# Patient Record
Sex: Male | Born: 1955 | Race: Black or African American | Hispanic: No | Marital: Married | Smoking: Current every day smoker
Health system: Southern US, Community
[De-identification: ages and names within clinical notes are randomized; demographics above are authoritative.]

## PROBLEM LIST (undated history)

## (undated) DIAGNOSIS — E119 Type 2 diabetes mellitus without complications: Secondary | ICD-10-CM

## (undated) DIAGNOSIS — E7800 Pure hypercholesterolemia, unspecified: Secondary | ICD-10-CM

## (undated) DIAGNOSIS — I10 Essential (primary) hypertension: Secondary | ICD-10-CM

---

## 2006-09-29 ENCOUNTER — Inpatient Hospital Stay (HOSPITAL_COMMUNITY): Admission: EM | Admit: 2006-09-29 | Discharge: 2006-09-30 | Payer: Self-pay | Admitting: Emergency Medicine

## 2018-06-09 ENCOUNTER — Emergency Department (HOSPITAL_BASED_OUTPATIENT_CLINIC_OR_DEPARTMENT_OTHER): Payer: POS

## 2018-06-09 ENCOUNTER — Emergency Department (HOSPITAL_BASED_OUTPATIENT_CLINIC_OR_DEPARTMENT_OTHER)
Admission: EM | Admit: 2018-06-09 | Discharge: 2018-06-09 | Disposition: A | Discharge status: Home / Self Care | Payer: POS | Attending: Emergency Medicine | Admitting: Emergency Medicine

## 2018-06-09 ENCOUNTER — Other Ambulatory Visit: Payer: Self-pay

## 2018-06-09 ENCOUNTER — Encounter (HOSPITAL_BASED_OUTPATIENT_CLINIC_OR_DEPARTMENT_OTHER): Payer: Self-pay | Admitting: *Deleted

## 2018-06-09 DIAGNOSIS — Y92009 Unspecified place in unspecified non-institutional (private) residence as the place of occurrence of the external cause: Secondary | ICD-10-CM | POA: Insufficient documentation

## 2018-06-09 DIAGNOSIS — E119 Type 2 diabetes mellitus without complications: Secondary | ICD-10-CM | POA: Diagnosis not present

## 2018-06-09 DIAGNOSIS — S39012A Strain of muscle, fascia and tendon of lower back, initial encounter: Secondary | ICD-10-CM

## 2018-06-09 DIAGNOSIS — Z7984 Long term (current) use of oral hypoglycemic drugs: Secondary | ICD-10-CM | POA: Diagnosis not present

## 2018-06-09 DIAGNOSIS — Z79899 Other long term (current) drug therapy: Secondary | ICD-10-CM | POA: Insufficient documentation

## 2018-06-09 DIAGNOSIS — S20212A Contusion of left front wall of thorax, initial encounter: Secondary | ICD-10-CM | POA: Insufficient documentation

## 2018-06-09 DIAGNOSIS — W11XXXA Fall on and from ladder, initial encounter: Secondary | ICD-10-CM | POA: Insufficient documentation

## 2018-06-09 DIAGNOSIS — F17210 Nicotine dependence, cigarettes, uncomplicated: Secondary | ICD-10-CM | POA: Diagnosis not present

## 2018-06-09 DIAGNOSIS — W19XXXA Unspecified fall, initial encounter: Secondary | ICD-10-CM

## 2018-06-09 DIAGNOSIS — I10 Essential (primary) hypertension: Secondary | ICD-10-CM | POA: Insufficient documentation

## 2018-06-09 DIAGNOSIS — Y939 Activity, unspecified: Secondary | ICD-10-CM | POA: Insufficient documentation

## 2018-06-09 DIAGNOSIS — Y999 Unspecified external cause status: Secondary | ICD-10-CM | POA: Diagnosis not present

## 2018-06-09 DIAGNOSIS — S3992XA Unspecified injury of lower back, initial encounter: Secondary | ICD-10-CM | POA: Diagnosis present

## 2018-06-09 HISTORY — DX: Essential (primary) hypertension: I10

## 2018-06-09 HISTORY — DX: Pure hypercholesterolemia, unspecified: E78.00

## 2018-06-09 HISTORY — DX: Type 2 diabetes mellitus without complications: E11.9

## 2018-06-09 MED ORDER — ACETAMINOPHEN 500 MG PO TABS
1000.0000 mg | ORAL_TABLET | Freq: Once | ORAL | Status: AC
  Administered 2018-06-09: 1000 mg via ORAL
  Filled 2018-06-09: qty 2

## 2018-06-09 NOTE — ED Triage Notes (Signed)
Pt reports he fell 5-6 ft from a ladder this afternoon. States he landed on left shoulder. C/o back pain. Denies LOC. Pt ambulatory to triage

## 2018-06-09 NOTE — ED Provider Notes (Signed)
[facility] EMERGENCY DEPARTMENT Provider Note   CSN: [id number] Arrival date & time: 06/09/18  1928     History   Chief Complaint Chief Complaint  Patient presents with  . Fall    HPI Sergio Cole is a 62 y.o. male.  Patient is a 62 year old male with a history of diabetes, hypertension and hyperlipidemia who presents with left side pain after a fall.  He was doing some work at home on a ladder.  He states he overreached and fell off a ladder about 6 feet, landed on the ground.  He landed on his left side.  He states he did not hit his head.  He denies any neck pain.  There is no loss of consciousness.  He denies any headache.  He has some soreness in his lower back and along his left ribs and left shoulder.  He states he has not had to take anything at home for the pain.  He denies any pain on breathing.  No shortness of breath.  No abdominal pain.  No nausea or vomiting.  He denies any other injuries from the fall.  He denies any numbness or weakness to his extremities.     Past Medical History:  Diagnosis Date  . Diabetes mellitus without complication (HCC)   . High cholesterol   . Hypertension     There are no active problems to display for this patient.   History reviewed. No pertinent surgical history.      Home Medications    Prior to Admission medications   Medication Sig Start Date End Date Taking? Authorizing Provider  fenofibrate 160 MG tablet TAKE 1 TABLET (160 MG TOTAL) BY MOUTH DAILY. 10/02/17  Yes [provider]  lisinopril-hydrochlorothiazide (PRINZIDE,ZESTORETIC) 20-12.5 MG tablet TAKE 2 TABLETS BY MOUTH DAILY. 10/02/17  Yes [provider]  metFORMIN (GLUCOPHAGE) 500 MG tablet TAKE 1 TABLET BY MOUTH TWICE A DAY WITH MEALS 10/02/17  Yes [provider]    Family History No family history on file.  Social History Social History   Tobacco Use  . Smoking status: Current Every Day Smoker    Types: Cigarettes, Pipe    . Smokeless tobacco: Never Used  Substance Use Topics  . Alcohol use: Yes    Comment: occasional  . Drug use: Never     Allergies   Patient has no known allergies.   Review of Systems Review of Systems  Constitutional: Negative for activity change, appetite change and fever.  HENT: Negative for dental problem, nosebleeds and trouble swallowing.   Eyes: Negative for pain and visual disturbance.  Respiratory: Negative for shortness of breath.   Cardiovascular: Positive for chest pain (left rib pain).  Gastrointestinal: Negative for abdominal pain, nausea and vomiting.  Genitourinary: Negative for dysuria and hematuria.  Musculoskeletal: Positive for arthralgias and back pain. Negative for joint swelling and neck pain.  Skin: Negative for wound.  Neurological: Negative for weakness, numbness and headaches.  Psychiatric/Behavioral: Negative for confusion.     Physical Exam Updated Vital Signs BP (!) 155/103 (BP Location: Left Arm)   Pulse 66   Temp 98.6 F (37 C) (Oral)   Resp 18   Ht 5\' 10"  (1.778 m)   Wt 95.3 kg   SpO2 100%   BMI 30.13 kg/m   Physical Exam  Constitutional: He is oriented to person, place, and time. He appears well-developed and well-nourished.  HENT:  Head: Normocephalic and atraumatic.  Nose: Nose normal.  Eyes: Pupils are equal,  round, and reactive to light. Conjunctivae are normal.  Neck:  No pain to the cervical or thoracic spine.  There is some mild tenderness to the lower lumbar sacral spine but primarily the tenderness is along the musculature in the lumbar region bilaterally.  There is no step-offs or deformities.  Cardiovascular: Normal rate and regular rhythm.  No murmur heard. No evidence of external trauma to the chest or abdomen  Pulmonary/Chest: Effort normal and breath sounds normal. No respiratory distress. He has no wheezes. He exhibits tenderness.  Mild tenderness on palpation of the left posterior ribs.  No crepitus or  deformity.  Abdominal: Soft. Bowel sounds are normal. He exhibits no distension. There is no tenderness.  Musculoskeletal: Normal range of motion.  There is some mild tenderness on range of motion of the left shoulder.  No deformity noted.  No pain to the elbow or wrist.  There is minimal tenderness to the right thumb but he has full range of motion without any swelling or deformity noted.  No distinct bony tenderness is noted to his thumb.  There is no other pain on palpation or range of motion of the extremities.  Neurological: He is alert and oriented to person, place, and time.  Motor 5 out of 5 all extremities, sensation grossly intact light touch all extremities  Skin: Skin is warm and dry. Capillary refill takes less than 2 seconds.  Psychiatric: He has a normal mood and affect.  Vitals reviewed.    ED Treatments / Results  Labs (all labs ordered are listed, but only abnormal results are displayed) Labs Reviewed - No data to display  EKG None  Radiology Dg Ribs Unilateral W/chest Left  Result Date: 06/09/2018 CLINICAL DATA:  Patient fell from a ladder. Posterior rib pain and left flank pain. EXAM: LEFT RIBS AND CHEST - 3+ VIEW COMPARISON:  Chest x-ray 09/29/2006. FINDINGS: Radio-opaque marker has been placed on the skin at the site of patient concern. No evidence for an acute displaced left-sided rib fracture. No evidence for left pleural effusion. No left pneumothorax evident. No focal airspace consolidation in the left lung. Visualized bony anatomy of the shoulder shows degenerative changes in the [facility] joint. IMPRESSION: Negative. Electronically Signed   By: Kennith Center M.D.   On: 06/09/2018 21:46   Dg Lumbar Spine Complete  Result Date: 06/09/2018 CLINICAL DATA:  Patient fell from a ladder this evening and presents with posterior rib and left flank pain. EXAM: LUMBAR SPINE - COMPLETE 4+ VIEW COMPARISON:  None. FINDINGS: There are 5 non ribbed lumbar type vertebrae in normal  lumbar lordosis. No pars defects or listhesis. No acute lumbar spine fracture or suspicious osseous lesions. Facet arthropathy with joint space narrowing sclerosis is noted from L3 through S1. Disc spaces are maintained without significant flattening. The included iliac bones and sacroiliac joints appear intact with mild osteoarthritic sclerosis of the SI joints. No diastasis. A lucency involving the left costovertebral junction at T12 may be secondary to osteoarthritic change given what appears to be spurring. However a fracture is not entirely excluded given this appearance. IMPRESSION: Lucency with bony hypertrophic changes at the costovertebral junction of the left twelfth rib. Findings are more likely related to osteoarthritis however the possibility of a subtle fracture is not entirely excluded. Consider CT the lumbar spine for better correlation as deemed clinically necessary. Lumbar facet arthrosis.  No acute appearing lumbar spine fracture. Electronically Signed   By: Tollie Eth M.D.   On: 06/09/2018 21:59  Ct Lumbar Spine Wo Contrast  Result Date: 06/09/2018 CLINICAL DATA:  62 year old male with fall and trauma to the back. EXAM: CT LUMBAR SPINE WITHOUT CONTRAST TECHNIQUE: Multidetector CT imaging of the lumbar spine was performed without intravenous contrast administration. Multiplanar CT image reconstructions were also generated. COMPARISON:  Lumbar spine radiograph dated 06/09/2018 FINDINGS: Segmentation: 5 lumbar type vertebrae. Alignment: No acute subluxation. Vertebrae: No acute fracture. Multiple small sclerotic lesions primarily involving the posterior left iliac bone and left sacral alum, indeterminate, likely bone islands. Multilevel lower lumbar facet hypertrophy. Chronic changes of the SI joints with partial bony fusion. Paraspinal and other soft tissues: There is atherosclerotic calcification of the aorta. The paraspinal soft tissues are unremarkable. Disc levels: There are degenerative  changes of the spine with anterior osteophyte. There is posterior disc bulge at L5-S1. IMPRESSION: No acute/traumatic lumbar spine pathology. Electronically Signed   By: Elgie Collard M.D.   On: 06/09/2018 23:03   Dg Shoulder Left  Result Date: 06/09/2018 CLINICAL DATA:  Fall from ladder.  Left flank and shoulder pain. EXAM: LEFT SHOULDER - 2+ VIEW COMPARISON:  None. FINDINGS: No evidence for acute fracture. No shoulder separation or dislocation. Degenerative changes are seen in the acromioclavicular and glenohumeral joints. Degenerative irregularity is seen at the rotator cuff insertion. IMPRESSION: Degenerative changes without acute bony abnormality. Electronically Signed   By: Kennith Center M.D.   On: 06/09/2018 21:50    Procedures Procedures (including critical care time)  Medications Ordered in ED Medications  acetaminophen (TYLENOL) tablet 1,000 mg (1,000 mg Oral Given 06/09/18 2048)     Initial Impression / Assessment and Plan / ED Course  I have reviewed the triage vital signs and the nursing notes.  Pertinent labs & imaging results that were available during my care of the patient were reviewed by me and considered in my medical decision making (see chart for details).     Patient is a 62 year old male who presents after a fall.  He is not complaining of significant pain but did have some mild tenderness over his left ribs, left shoulder and lower lumbar spine.  He has no abdominal tenderness.  No evidence of external trauma on his chest or abdomen.  No reported head injury.  He is neurologically intact.  His imaging studies do not reveal any evidence of fracture.  The lumbar x-rays were questionable for fracture but a follow-up CT was done which shows no evidence of fracture.  Patient is feeling okay.  Does not request anything for pain other than Tylenol.  He was encouraged to have outpatient follow-up with his PCP.  Final Clinical Impressions(s) / ED Diagnoses   Final  diagnoses:  Fall, initial encounter  Rib contusion, left, initial encounter  Strain of lumbar region, initial encounter    ED Discharge Orders    None       Rolan Bucco, MD 06/09/18 2321

## 2018-06-09 NOTE — ED Notes (Signed)
Patient transported to X-ray 

## 2018-06-09 NOTE — ED Notes (Signed)
Called x1 for room no answer 

## 2018-06-09 NOTE — ED Notes (Signed)
p understood dc material. NAD noted

## 2019-10-28 IMAGING — DX DG LUMBAR SPINE COMPLETE 4+V
5 series · 5 of 5 positions shown · non-contrast
Comparison: None.

CLINICAL DATA: Patient fell from a ladder this evening and presents
with posterior rib and left flank pain.

EXAM:
LUMBAR SPINE - COMPLETE 4+ VIEW

[l-spine ap]
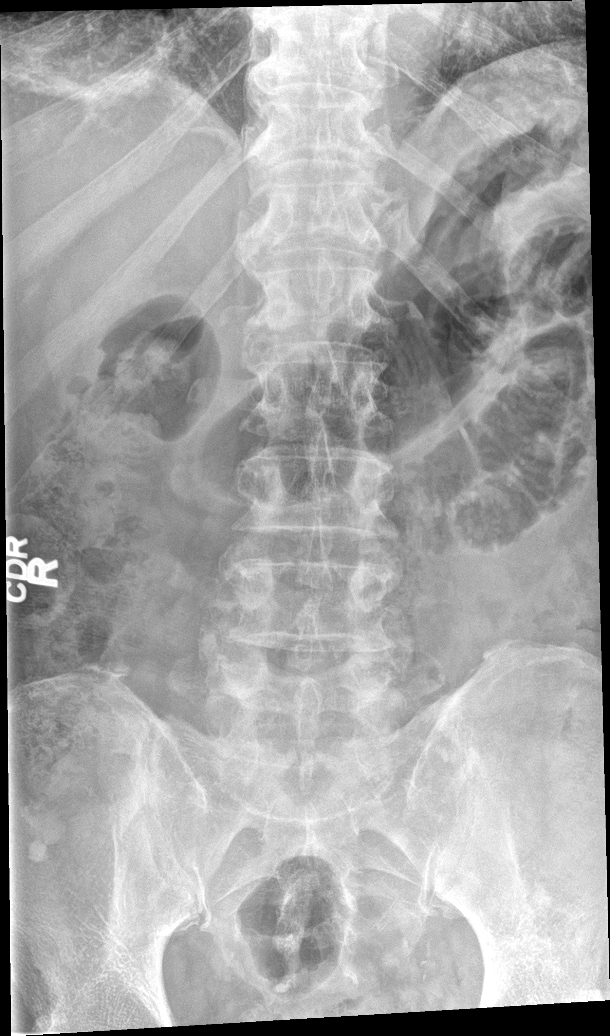

[l-spine obl (1 of 2)]
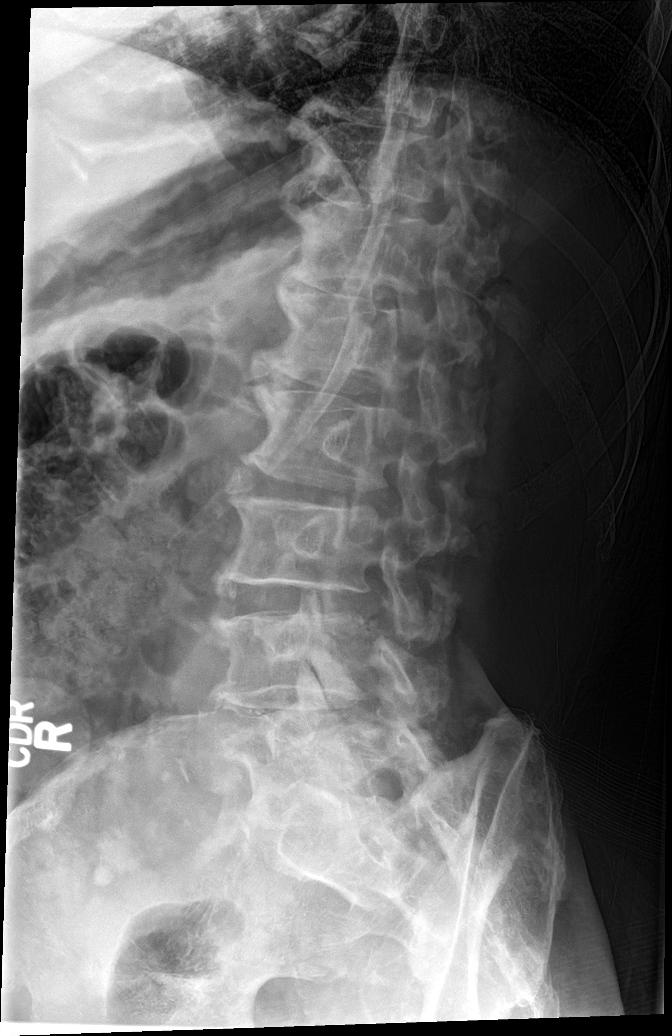

[l-spine obl (2 of 2)]
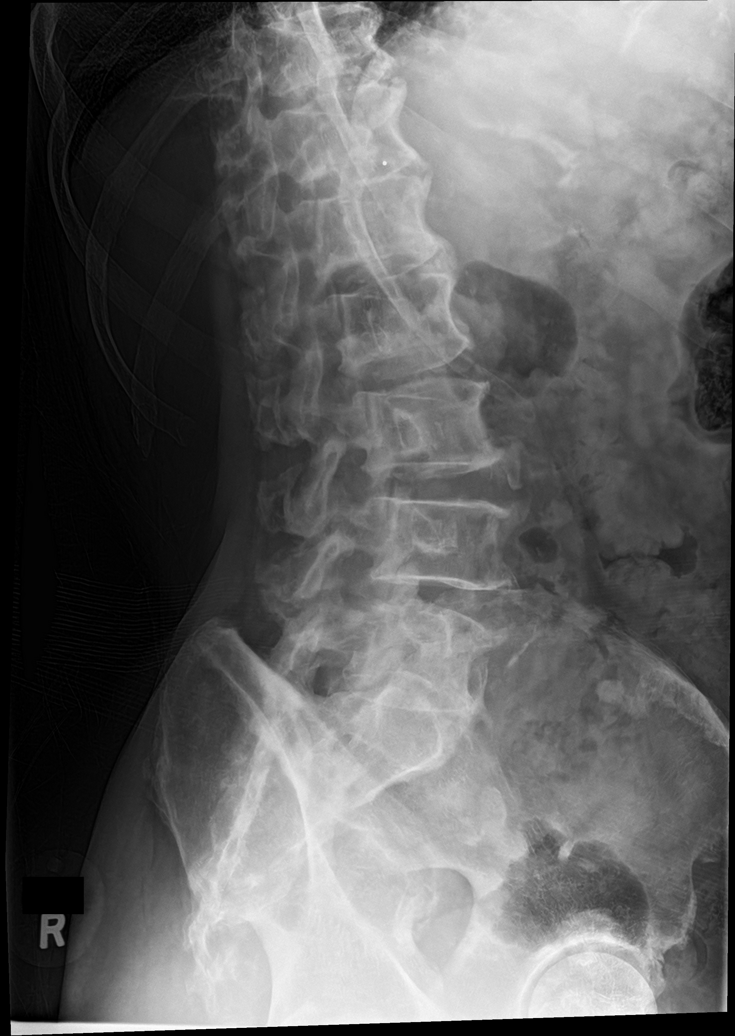

[l-spine lat]
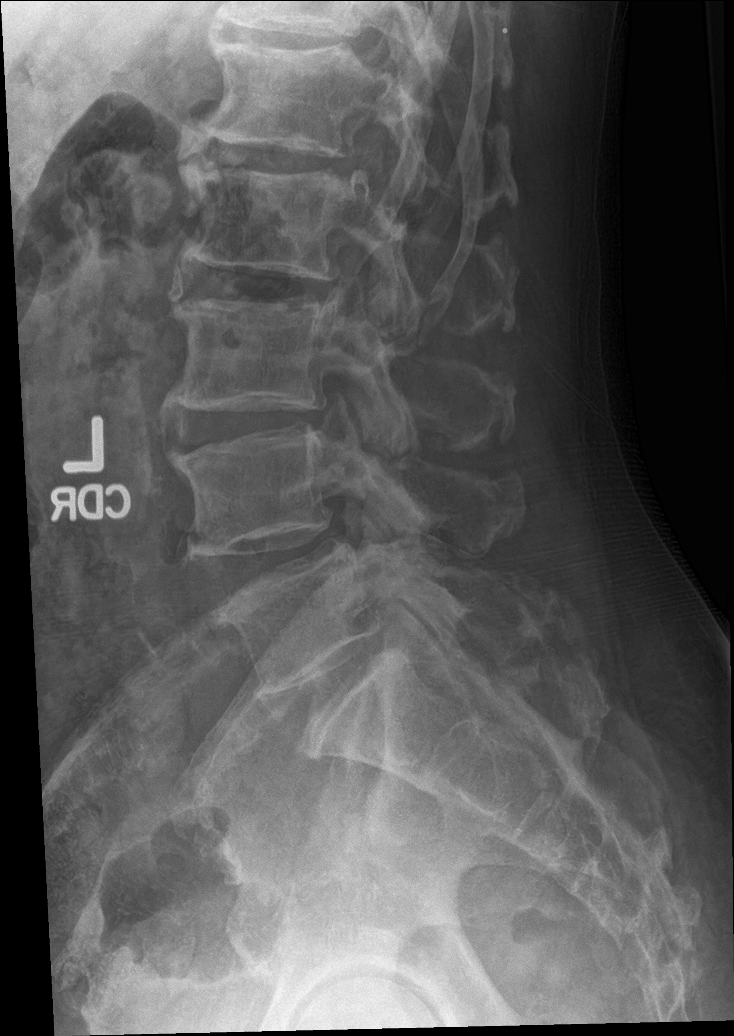

[l-spine spot]
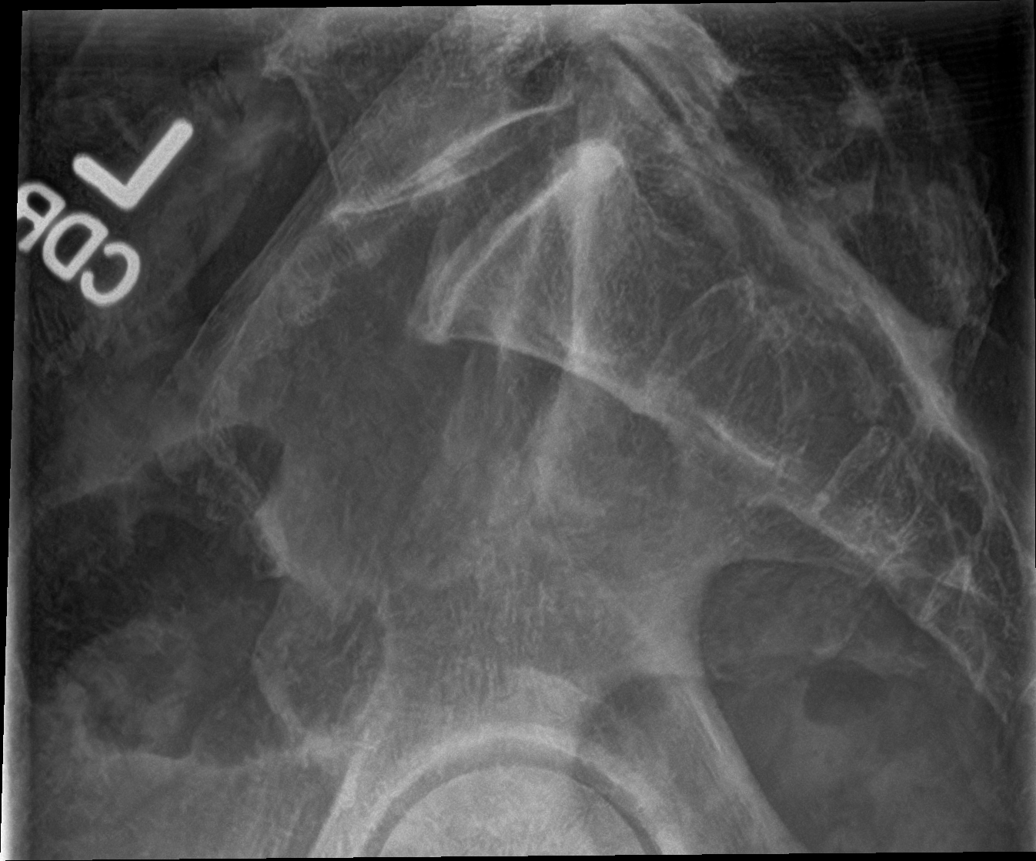

[5 of 5 positions shown; findings below may reference images not displayed]

FINDINGS: There are 5 non ribbed lumbar type vertebrae in normal lumbar
lordosis. No pars defects or listhesis. No acute lumbar spine
fracture or suspicious osseous lesions. Facet arthropathy with joint
space narrowing sclerosis is noted from L3 through S1. Disc spaces
are maintained without significant flattening. The included iliac
bones and sacroiliac joints appear intact with mild osteoarthritic
sclerosis of the SI joints. No diastasis.

A lucency involving the left costovertebral junction at T[DATE] be
secondary to osteoarthritic change given what appears to be
spurring. However a fracture is not entirely excluded given this
appearance.
IMPRESSION: Lucency with bony hypertrophic changes at the costovertebral
junction of the left twelfth rib. Findings are more likely related
to osteoarthritis however the possibility of a subtle fracture is
not entirely excluded. Consider CT the lumbar spine for better
correlation as deemed clinically necessary.

Lumbar facet arthrosis.  No acute appearing lumbar spine fracture.

## 2019-10-28 IMAGING — DX DG SHOULDER 2+V*L*
3 series · 3 of 3 positions shown · non-contrast
Comparison: None.

CLINICAL DATA: Fall from ladder.  Left flank and shoulder pain.

EXAM:
LEFT SHOULDER - 2+ VIEW

[shoulder grashey]
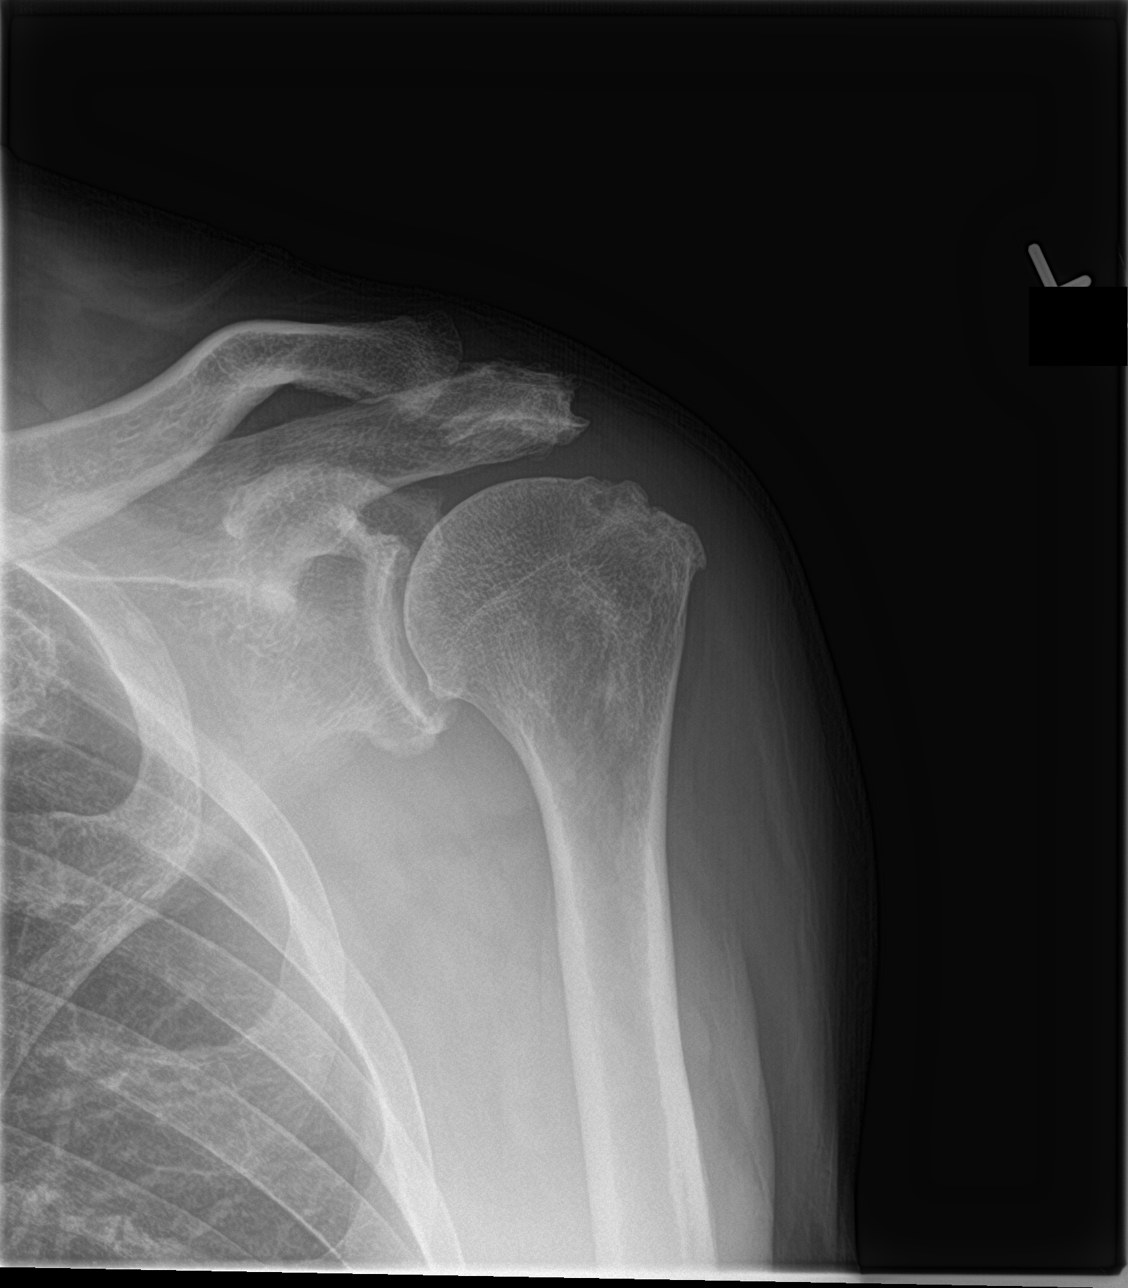

[shoulder axillary]
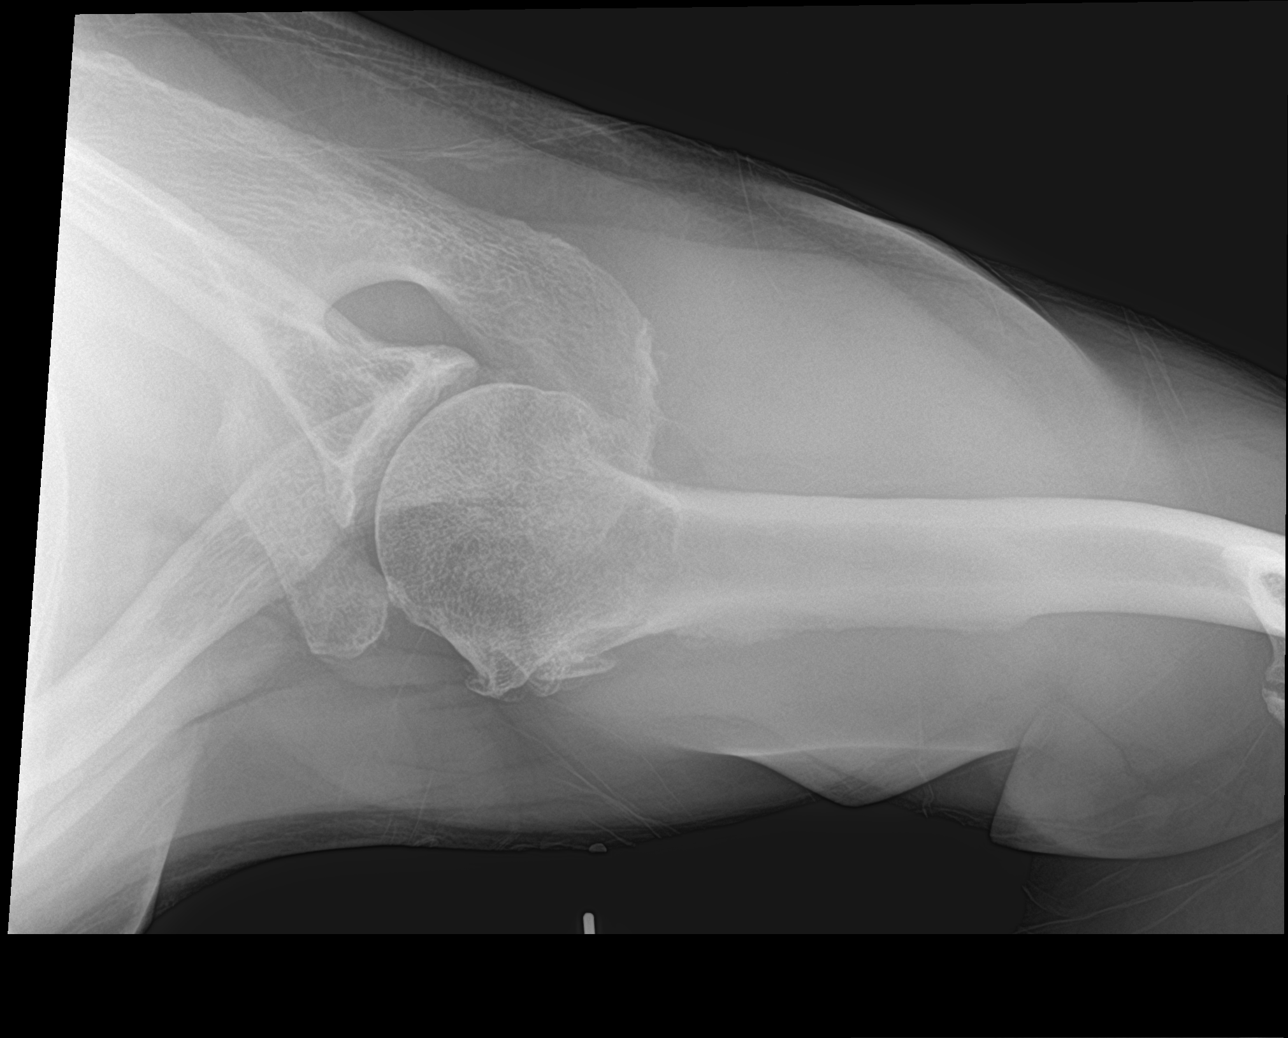

[shoulder y view]
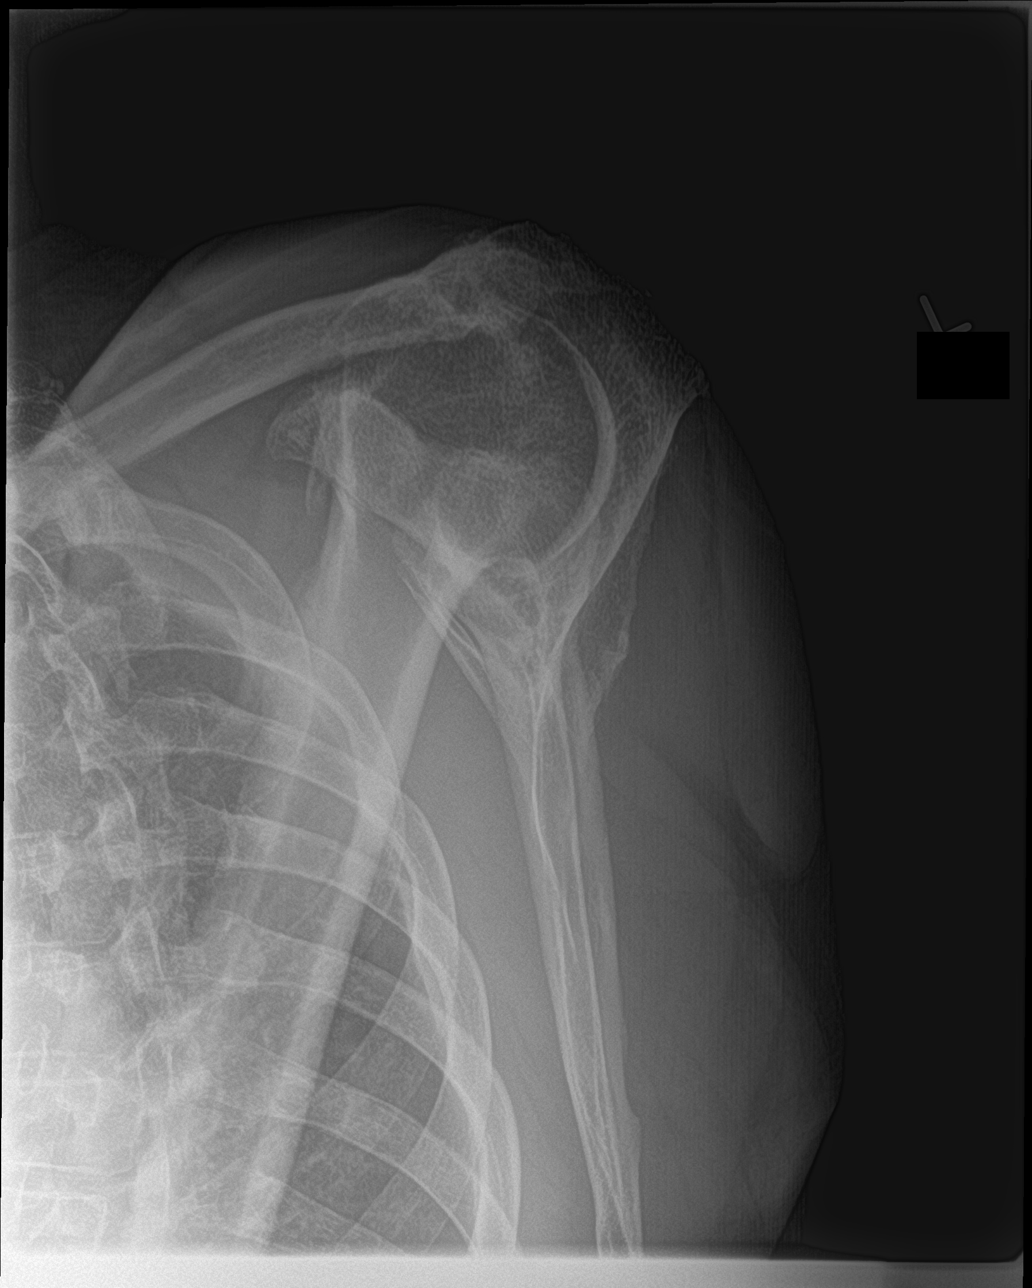

[3 of 3 positions shown; findings below may reference images not displayed]

FINDINGS: No evidence for acute fracture. No shoulder separation or
dislocation. Degenerative changes are seen in the acromioclavicular
and glenohumeral joints. Degenerative irregularity is seen at the
rotator cuff insertion.
IMPRESSION: Degenerative changes without acute bony abnormality.

## 2019-10-28 IMAGING — CT CT L SPINE W/O CM
3 series · 14 of 33 positions shown, 17 images · non-contrast
Comparison: Lumbar spine radiograph dated 06/09/2018

CLINICAL DATA: 62-year-old male with fall and trauma to the back.

EXAM:
CT LUMBAR SPINE WITHOUT CONTRAST
TECHNIQUE: Multidetector CT imaging of the lumbar spine was performed without
intravenous contrast administration. Multiplanar CT image
reconstructions were also generated.

[Series 3: l spine soft · axial · 0.36mm/px · z∈[-275,-73]mm · 6 of 133 slices shown, 8 images]
[im 21/133  soft-tissue]
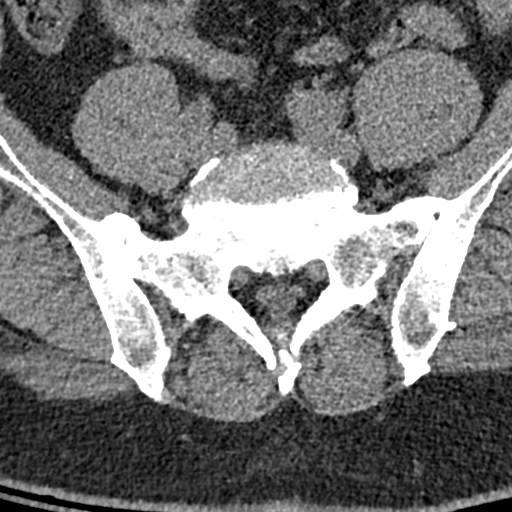
[im 21/133  bone]
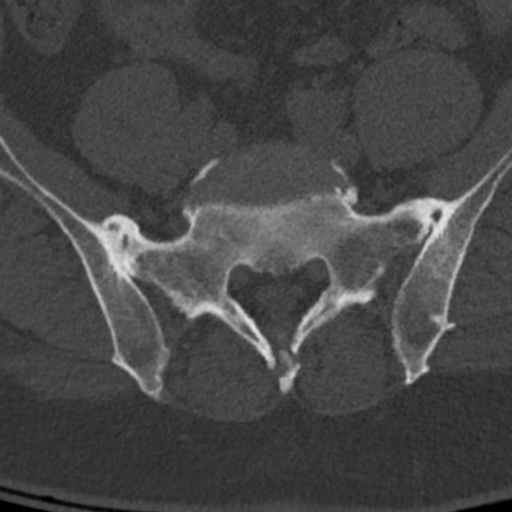
[im 41/133  bone]
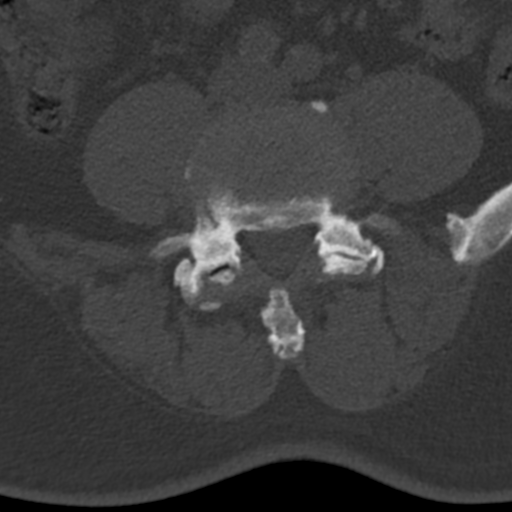
[im 61/133  bone]
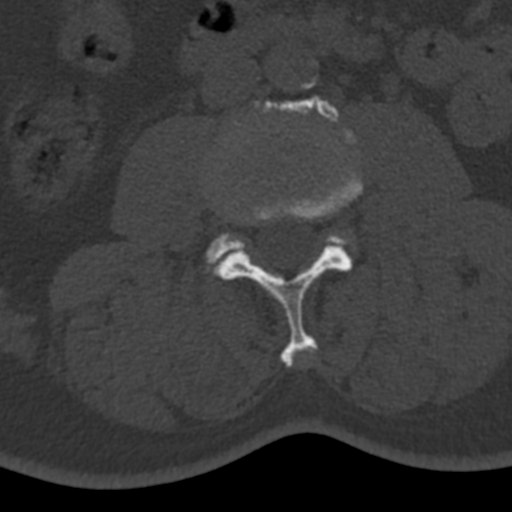
[im 82/133  bone]
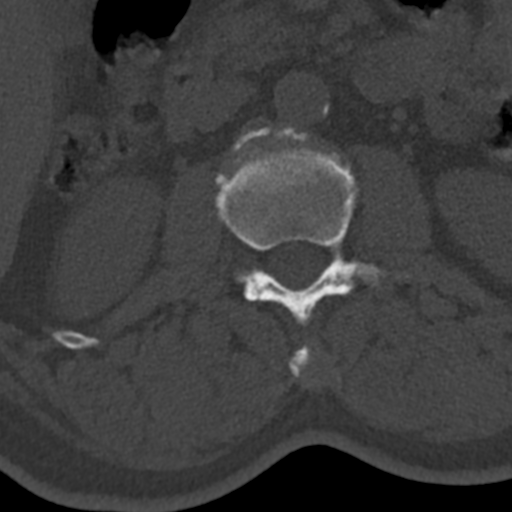
[im 102/133  soft-tissue]
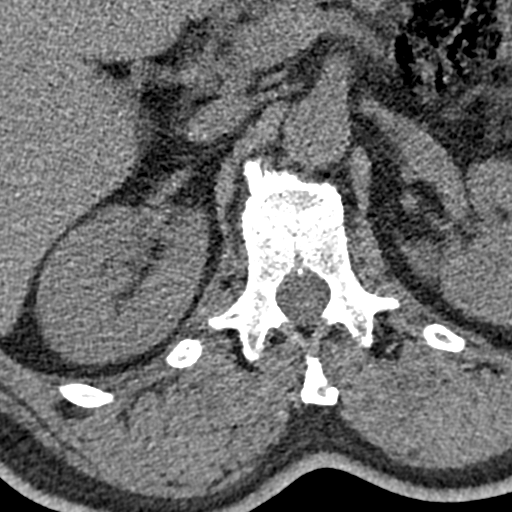
[im 102/133  bone]
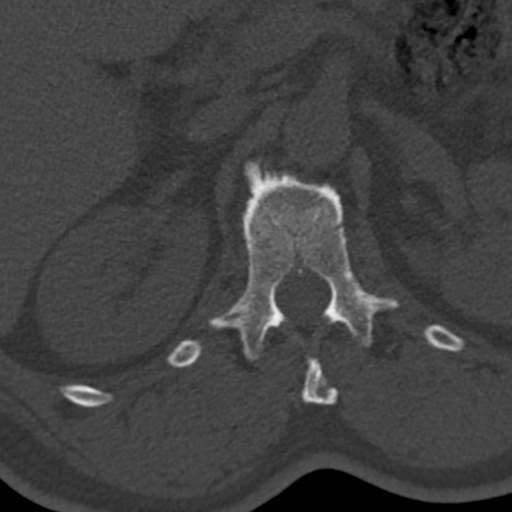
[im 122/133  bone]
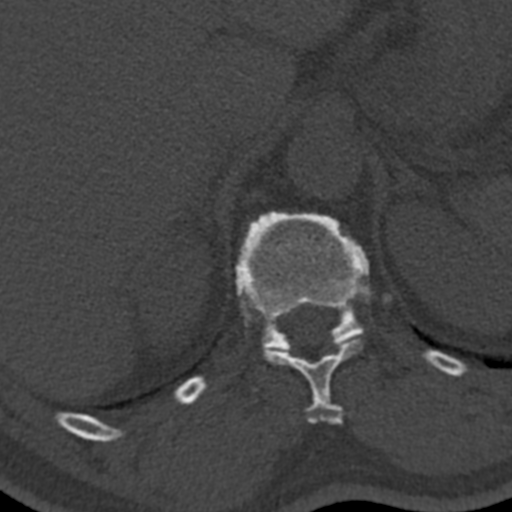

[Series 4: sagittal bone · sagittal · 0.39mm/px · 5 of 76 slices shown, 6 images]
[im 26/76  bone]
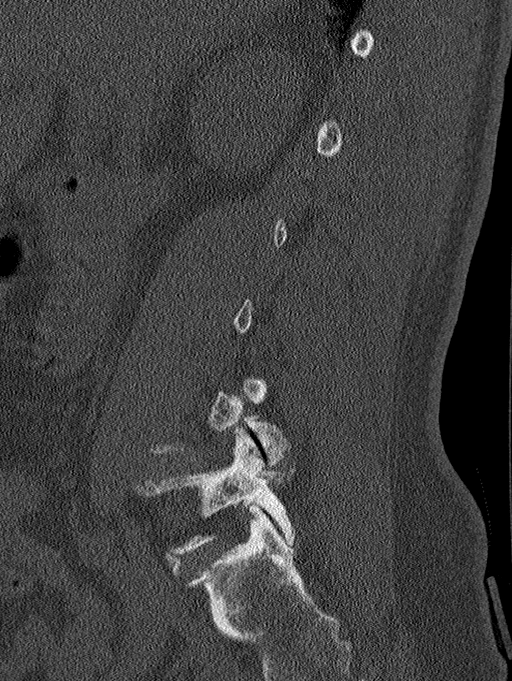
[im 32/76  bone]
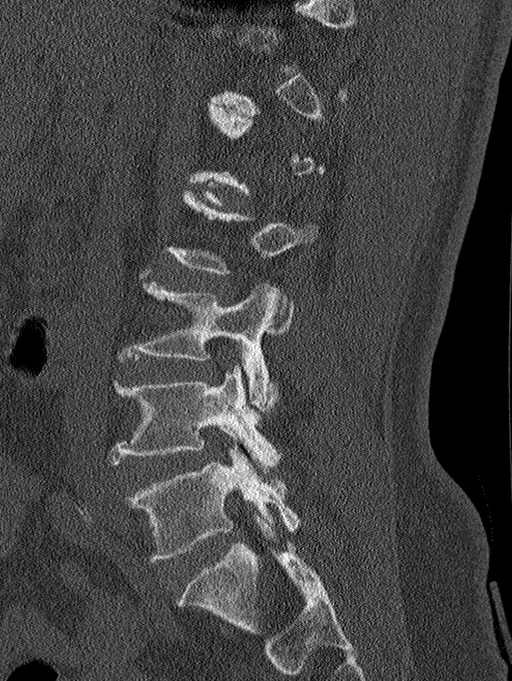
[im 38/76  soft-tissue]
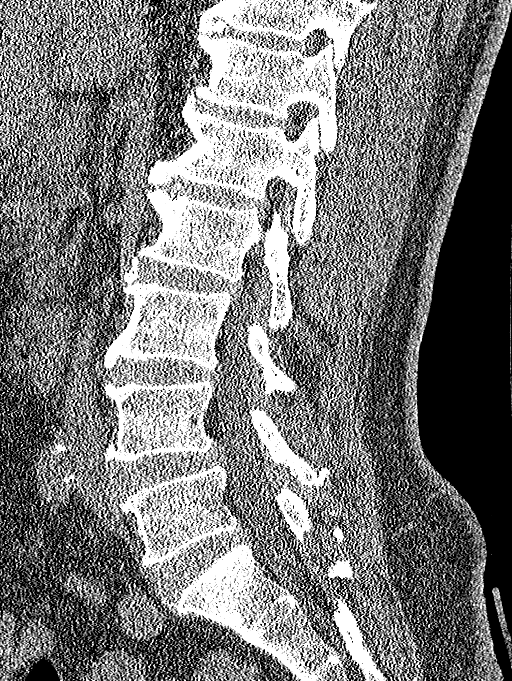
[im 38/76  bone]
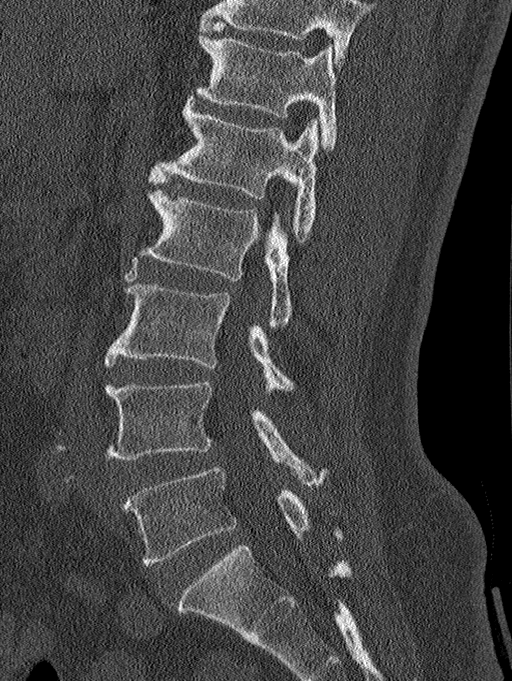
[im 44/76  bone]
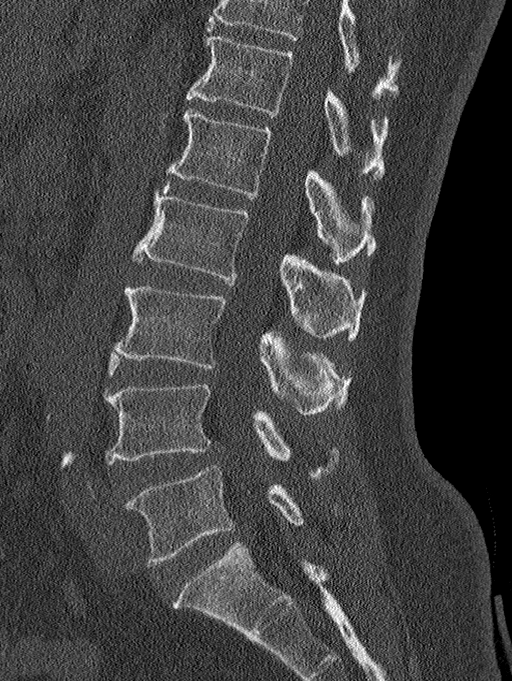
[im 51/76  bone]
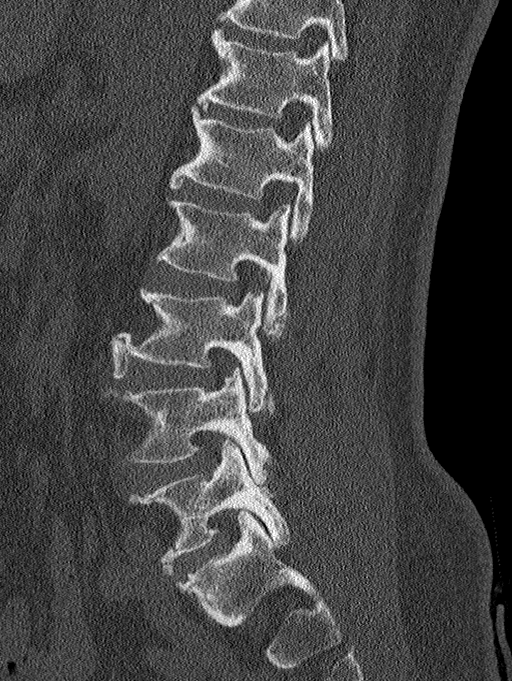

[Series 5: coronal bone · coronal · 0.39mm/px · 3 of 93 slices shown]
[im 19/93  bone]
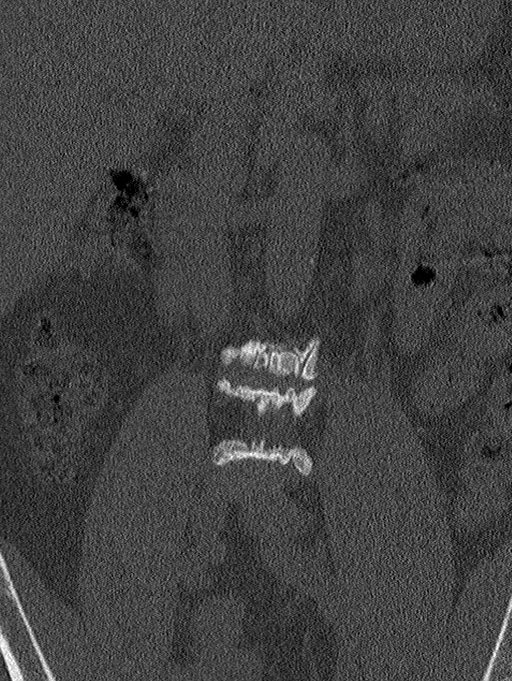
[im 37/93  bone]
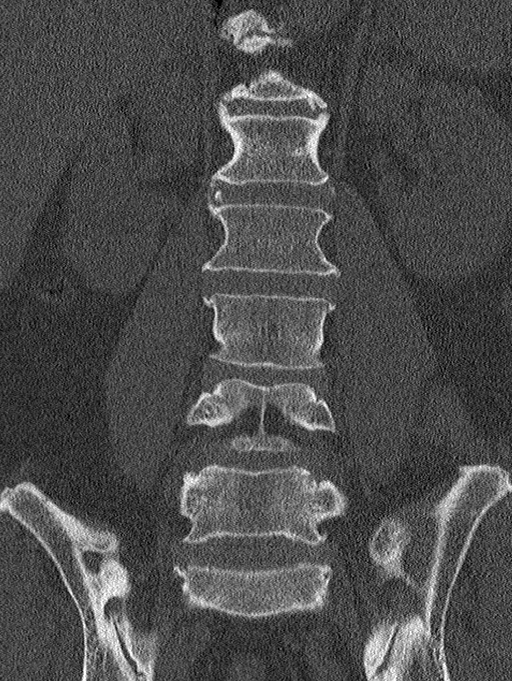
[im 56/93  bone]
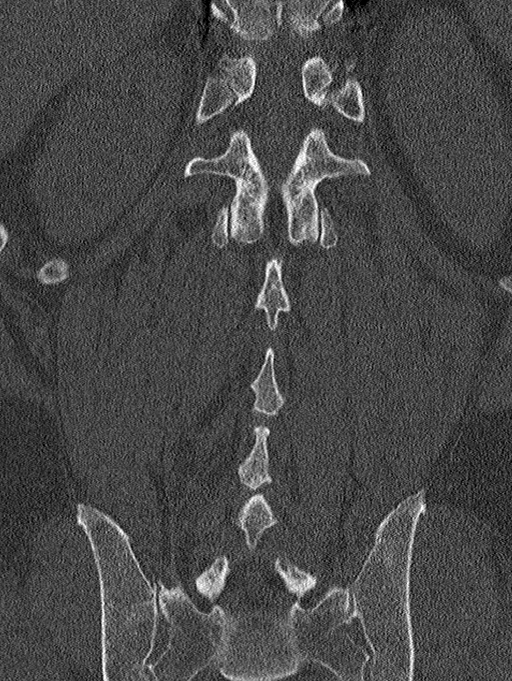

[14 of 33 positions shown; findings below may reference images not displayed]

FINDINGS: Segmentation: 5 lumbar type vertebrae.

Alignment: No acute subluxation.

Vertebrae: No acute fracture. Multiple small sclerotic lesions
primarily involving the posterior left iliac bone and left sacral
alum, indeterminate, likely bone islands. Multilevel lower lumbar
facet hypertrophy. Chronic changes of the SI joints with partial
bony fusion.

Paraspinal and other soft tissues: There is atherosclerotic
calcification of the aorta. The paraspinal soft tissues are
unremarkable.

Disc levels: There are degenerative changes of the spine with
anterior osteophyte. There is posterior disc bulge at L5-S1.
IMPRESSION: No acute/traumatic lumbar spine pathology.

## 2023-06-03 ENCOUNTER — Other Ambulatory Visit: Payer: Self-pay

## 2023-06-03 ENCOUNTER — Emergency Department (HOSPITAL_BASED_OUTPATIENT_CLINIC_OR_DEPARTMENT_OTHER)
Admission: EM | Admit: 2023-06-03 | Discharge: 2023-06-03 | Disposition: A | Discharge status: Home / Self Care | Payer: 59 | Attending: Emergency Medicine | Admitting: Emergency Medicine

## 2023-06-03 ENCOUNTER — Encounter (HOSPITAL_BASED_OUTPATIENT_CLINIC_OR_DEPARTMENT_OTHER): Payer: Self-pay | Admitting: *Deleted

## 2023-06-03 ENCOUNTER — Emergency Department (HOSPITAL_BASED_OUTPATIENT_CLINIC_OR_DEPARTMENT_OTHER): Payer: 59

## 2023-06-03 DIAGNOSIS — Z7984 Long term (current) use of oral hypoglycemic drugs: Secondary | ICD-10-CM | POA: Diagnosis not present

## 2023-06-03 DIAGNOSIS — I10 Essential (primary) hypertension: Secondary | ICD-10-CM | POA: Diagnosis not present

## 2023-06-03 DIAGNOSIS — K047 Periapical abscess without sinus: Secondary | ICD-10-CM | POA: Diagnosis not present

## 2023-06-03 DIAGNOSIS — E119 Type 2 diabetes mellitus without complications: Secondary | ICD-10-CM | POA: Diagnosis not present

## 2023-06-03 DIAGNOSIS — Z20822 Contact with and (suspected) exposure to covid-19: Secondary | ICD-10-CM | POA: Diagnosis not present

## 2023-06-03 DIAGNOSIS — J020 Streptococcal pharyngitis: Secondary | ICD-10-CM | POA: Insufficient documentation

## 2023-06-03 DIAGNOSIS — R000 Tachycardia, unspecified: Secondary | ICD-10-CM | POA: Insufficient documentation

## 2023-06-03 DIAGNOSIS — Z79899 Other long term (current) drug therapy: Secondary | ICD-10-CM | POA: Insufficient documentation

## 2023-06-03 DIAGNOSIS — J029 Acute pharyngitis, unspecified: Secondary | ICD-10-CM | POA: Diagnosis present

## 2023-06-03 LAB — CBC WITH DIFFERENTIAL/PLATELET
Abs Immature Granulocytes: 0.13 10*3/uL — ABNORMAL HIGH (ref 0.00–0.07)
Basophils Absolute: 0 10*3/uL (ref 0.0–0.1)
Basophils Relative: 0 %
Eosinophils Absolute: 0 10*3/uL (ref 0.0–0.5)
Eosinophils Relative: 0 %
HCT: 42.6 % (ref 39.0–52.0)
Hemoglobin: 13.9 g/dL (ref 13.0–17.0)
Immature Granulocytes: 1 %
Lymphocytes Relative: 10 %
Lymphs Abs: 2.2 10*3/uL (ref 0.7–4.0)
MCH: 27.1 pg (ref 26.0–34.0)
MCHC: 32.6 g/dL (ref 30.0–36.0)
MCV: 83 fL (ref 80.0–100.0)
Monocytes Absolute: 2.1 10*3/uL — ABNORMAL HIGH (ref 0.1–1.0)
Monocytes Relative: 10 %
Neutro Abs: 16.5 10*3/uL — ABNORMAL HIGH (ref 1.7–7.7)
Neutrophils Relative %: 79 %
Platelets: 321 10*3/uL (ref 150–400)
RBC: 5.13 MIL/uL (ref 4.22–5.81)
RDW: 15.9 % — ABNORMAL HIGH (ref 11.5–15.5)
WBC: 20.9 10*3/uL — ABNORMAL HIGH (ref 4.0–10.5)
nRBC: 0 % (ref 0.0–0.2)

## 2023-06-03 LAB — BASIC METABOLIC PANEL
Anion gap: 16 — ABNORMAL HIGH (ref 5–15)
BUN: 18 mg/dL (ref 8–23)
CO2: 22 mmol/L (ref 22–32)
Calcium: 9.1 mg/dL (ref 8.9–10.3)
Chloride: 98 mmol/L (ref 98–111)
Creatinine, Ser: 0.97 mg/dL (ref 0.61–1.24)
GFR, Estimated: >60 mL/min (ref >60)
Glucose, Bld: 159 mg/dL — ABNORMAL HIGH (ref 70–99)
Potassium: 3.5 mmol/L (ref 3.5–5.1)
Sodium: 136 mmol/L (ref 135–145)

## 2023-06-03 LAB — RESP PANEL BY RT-PCR (RSV, FLU A&B, COVID)  RVPGX2
Influenza A by PCR: NEGATIVE
Influenza B by PCR: NEGATIVE
Resp Syncytial Virus by PCR: NEGATIVE
SARS Coronavirus 2 by RT PCR: NEGATIVE

## 2023-06-03 LAB — GROUP A STREP BY PCR: Group A Strep by PCR: DETECTED — AB

## 2023-06-03 MED ORDER — ACETAMINOPHEN 500 MG PO TABS
1000.0000 mg | ORAL_TABLET | ORAL | Status: AC
  Administered 2023-06-03: 1000 mg via ORAL
  Filled 2023-06-03: qty 2

## 2023-06-03 MED ORDER — SODIUM CHLORIDE 0.9 % IV BOLUS
1000.0000 mL | Freq: Once | INTRAVENOUS | Status: AC
  Administered 2023-06-03: 1000 mL via INTRAVENOUS

## 2023-06-03 MED ORDER — AMOXICILLIN-POT CLAVULANATE 875-125 MG PO TABS: 1.0000 | ORAL_TABLET | Freq: Two times a day (BID) | ORAL | 0 refills | Status: AC

## 2023-06-03 MED ORDER — IOHEXOL 300 MG/ML  SOLN
75.0000 mL | Freq: Once | INTRAMUSCULAR | Status: AC | PRN
  Administered 2023-06-03: 75 mL via INTRAVENOUS

## 2023-06-03 MED ORDER — DEXAMETHASONE SODIUM PHOSPHATE 10 MG/ML IJ SOLN
10.0000 mg | Freq: Once | INTRAMUSCULAR | Status: AC
  Administered 2023-06-03: 10 mg via INTRAVENOUS
  Filled 2023-06-03: qty 1

## 2023-06-03 MED ORDER — SODIUM CHLORIDE 0.9 % IV SOLN: 3.0000 g | Freq: Once | INTRAVENOUS | Status: DC

## 2023-06-03 MED ORDER — LIDOCAINE VISCOUS HCL 2 % MT SOLN
15.0000 mL | Freq: Once | OROMUCOSAL | Status: AC
  Administered 2023-06-03: 15 mL via OROMUCOSAL
  Filled 2023-06-03: qty 15

## 2023-06-03 MED ORDER — SODIUM CHLORIDE 0.9 % IV SOLN
1.0000 g | Freq: Once | INTRAVENOUS | Status: AC
  Administered 2023-06-03: 1 g via INTRAVENOUS
  Filled 2023-06-03: qty 10

## 2023-06-03 MED ORDER — SODIUM CHLORIDE 0.9 % IV SOLN: 1.5000 g | INTRAVENOUS | Status: DC

## 2023-06-03 NOTE — ED Triage Notes (Signed)
Patient presents to ED via POV from home. Here with sore throat that began on Tuesday. Family reports muffled voice.

## 2023-06-03 NOTE — ED Provider Notes (Signed)
EMERGENCY DEPARTMENT AT [facility] Provider Note   CSN: [id number] Arrival date & time: 06/03/23  1200     History  Chief Complaint  Patient presents with   Sore Throat    Sergio Cole is a 67 y.o. male.  67 year old male with a history of diabetes, hypertension, hyperlipidemia, and melanoma in remission who presents to the emergency department with sore throat.  Patient reports that on Tuesday started feeling congested.  Since then has developed a sore throat as well as muffled voice.  Does not believe that he has had any fevers.  Took a negative home COVID test on Wednesday.  Denies any cough or shortness of breath.  Has a younger family member with similar symptoms who is sick now as well.  Says that is very painful to swallow but is still tolerating his secretions.       Home Medications Prior to Admission medications   Medication Sig Start Date End Date Taking? Authorizing Provider  amoxicillin-clavulanate (AUGMENTIN) 875-125 MG tablet Take 1 tablet by mouth every 12 (twelve) hours. 06/03/23  Yes Rondel Baton, MD  fenofibrate 160 MG tablet TAKE 1 TABLET (160 MG TOTAL) BY MOUTH DAILY. 10/02/17   [provider]  lisinopril-hydrochlorothiazide (PRINZIDE,ZESTORETIC) 20-12.5 MG tablet TAKE 2 TABLETS BY MOUTH DAILY. 10/02/17   [provider]  metFORMIN (GLUCOPHAGE) 500 MG tablet TAKE 1 TABLET BY MOUTH TWICE A DAY WITH MEALS 10/02/17   [provider]      Allergies    Patient has no known allergies.    Review of Systems   Review of Systems  Physical Exam Updated Vital Signs BP (!) 174/78 (BP Location: Left Arm)   Pulse (!) 54   Temp 99.5 F (37.5 C) (Oral)   Resp 18   Ht 5\' 10"  (1.778 m)   Wt 95.3 kg   SpO2 98%   BMI 30.13 kg/m  Physical Exam Constitutional:      Appearance: He is well-developed.     Comments: Patient is tolerating his secretions.  Speaking in full sentences.  HENT:     Head: Normocephalic.      Nose: Congestion present.     Mouth/Throat:     Mouth: Mucous membranes are moist.     Pharynx: Posterior oropharyngeal erythema present. No oropharyngeal exudate.     Tonsils: 2+ on the right. 0 on the left.     Comments: Uvula deviated to the left slightly Eyes:     Conjunctiva/sclera: Conjunctivae normal.     Pupils: Pupils are equal, round, and reactive to light.  Cardiovascular:     Rate and Rhythm: Regular rhythm. Tachycardia present.     Heart sounds: Normal heart sounds.  Pulmonary:     Effort: Pulmonary effort is normal.     Breath sounds: Normal breath sounds. No stridor.  Musculoskeletal:     Cervical back: Normal range of motion and neck supple.  Lymphadenopathy:     Cervical: Cervical adenopathy (Right-sided anterior) present.  Neurological:     Mental Status: He is alert.     ED Results / Procedures / Treatments   Labs (all labs ordered are listed, but only abnormal results are displayed) Labs Reviewed  GROUP A STREP BY PCR - Abnormal; Notable for the following components:      Result Value   Group A Strep by PCR DETECTED (*)    All other components within normal limits  CBC WITH DIFFERENTIAL/PLATELET - Abnormal; Notable for the following  components:   WBC 20.9 (*)    RDW 15.9 (*)    Neutro Abs 16.5 (*)    Monocytes Absolute 2.1 (*)    Abs Immature Granulocytes 0.13 (*)    All other components within normal limits  BASIC METABOLIC PANEL - Abnormal; Notable for the following components:   Glucose, Bld 159 (*)    Anion gap 16 (*)    All other components within normal limits  RESP PANEL BY RT-PCR (RSV, FLU A&B, COVID)  RVPGX2  CULTURE, GROUP A STREP [facility])    EKG EKG Interpretation Date/Time:  Saturday June 03 2023 14:41:03 EDT Ventricular Rate:  108 PR Interval:  150 QRS Duration:  102 QT Interval:  350 QTC Calculation: 417 R Axis:   64  Text Interpretation: Sinus tachycardia atrial bigeminy Minimal ST depression, anterolateral leads Confirmed  by Vonita Moss [phone]) on 06/03/2023 3:03:24 PM  Radiology CT Soft Tissue Neck W Contrast  Result Date: 06/03/2023 CLINICAL DATA:  Sore throat beginning on Tuesday. Right-sided throat pain. EXAM: CT NECK WITH CONTRAST TECHNIQUE: Multidetector CT imaging of the neck was performed using the standard protocol following the bolus administration of intravenous contrast. RADIATION DOSE REDUCTION: This exam was performed according to the departmental dose-optimization program which includes automated exposure control, adjustment of the mA and/or kV according to patient size and/or use of iterative reconstruction technique. CONTRAST:  75mL OMNIPAQUE IOHEXOL 300 MG/ML  SOLN COMPARISON:  None Available. FINDINGS: Pharynx and larynx: Asymmetric enlargement of the right palatine tonsil is noted. Mild adenoid hypertrophy is present. Inflammatory changes are present in the right parapharyngeal fat. Soft tissue irregularity is present along the a glossal tonsillar sulcus bilaterally. Asymmetric soft tissue enhancement is present in the right piriform sinus. The left aryepiglottic fold is within normal limits. Vocal cords are midline and symmetric. Trachea is clear. Salivary glands: The submandibular and parotid glands and ducts are within normal limits. Thyroid: Normal Lymph nodes: Asymmetric enlarged right level 2 lymph nodes are mostly ovoid. No necrotic or suppurative nodes are present. No significant left-sided adenopathy is present. Vascular: Atherosclerotic calcifications are present at the carotid bifurcations bilaterally without significant stenosis. Limited intracranial: Within normal limits. Visualized orbits: The globes and orbits are within normal limits. Mastoids and visualized paranasal sinuses: Moderate mucosal thickening is present along the floor of the right maxillary sinus. Focal lucencies involving the first and second right maxillary molars potentially communicate with the right maxillary sinus.  Prominent dental caries and periapical lucency is associated with the residual left mandibular molar. Skeleton: Multilevel degenerative changes are present cervical spine. No focal osseous lesions are present. Upper chest: The lung apices are clear. The thoracic inlet is within normal limits. IMPRESSION: 1. Asymmetric enlargement of the right palatine tonsil with inflammatory changes in the right parapharyngeal fat. While this may represent acute tonsillitis, focal neoplasm is not excluded. Recommend direct visualization. 2. Soft tissue irregularity along the glossal tonsillar sulcus bilaterally and right piriform sinus. Recommend direct visualization and possibly ENT consultation. 3. Asymmetric enlarged right level 2 lymph nodes are likely reactive. No necrotic or suppurative nodes are present. 4. Moderate mucosal thickening along the floor of the right maxillary sinus. 5. Focal lucencies involving the first and second right maxillary molars potentially communicate with the right maxillary sinus. This likely represents a periapical abscess. 6. Prominent dental caries and periapical lucency associated with the residual left mandibular molar. Electronically Signed   By: Marin Roberts M.D.   On: 06/03/2023 14:49    Procedures Procedures  Medications Ordered in ED Medications  sodium chloride 0.9 % bolus 1,000 mL (1,000 mLs Intravenous New Bag/Given 06/03/23 1321)  dexamethasone (DECADRON) injection 10 mg (10 mg Intravenous Given 06/03/23 1322)  acetaminophen (TYLENOL) tablet 1,000 mg (1,000 mg Oral Given 06/03/23 1326)  lidocaine (XYLOCAINE) 2 % viscous mouth solution 15 mL (15 mLs Mouth/Throat Given 06/03/23 1326)  cefTRIAXone (ROCEPHIN) 1 g in sodium chloride 0.9 % 100 mL IVPB (0 g Intravenous Stopped 06/03/23 1404)  iohexol (OMNIPAQUE) 300 MG/ML solution 75 mL (75 mLs Intravenous Contrast Given 06/03/23 1415)    ED Course/ Medical Decision Making/ A&P                                 Medical  Decision Making Amount and/or Complexity of Data Reviewed Labs: ordered. Radiology: ordered.  Risk OTC drugs. Prescription drug management.   Sergio Cole is a 67 y.o. male with comorbidities that complicate the patient evaluation including diabetes, hypertension, hyperlipidemia, and melanoma in remission who presents to the emergency department with sore throat.   Initial Ddx:  Strep throat, peritonsillar abscess, phlegmon, URI  MDM/Course:  Patient presents to the emergency department congestion and sore throat.  On exam patient presents to the emergency department congestion and sore throat.  On exam has no signs of airway compromise but does have a deviated uvula with significant swelling on the right side of his throat that is concerning for possible peritonsillar abscess.  Patient was tachycardic and had a borderline temperature so was given Tylenol as well as IV fluids.  Was given ceftriaxone for presumed strep throat.  Rapid strep did indeed returns positive.  CT scan showed peritonsillar swelling but no discrete abscess.  Did also show periapical abscess of his teeth.  They did recommend follow-up with ENT to exclude a mass which we will do as an outpatient since he has no signs of airway compromise right now.  Upon re-evaluation patient main stable and heart rate improved.  Was also given Decadron for his pharyngitis and for symptomatic control.  COVID and flu negative.  Was discharged home with Augmentin and a work note and instructions to follow-up with ENT and dentistry.  This patient presents to the ED for concern of complaints listed in HPI, this involves an extensive number of treatment options, and is a complaint that carries with it a high risk of complications and morbidity. Disposition including potential need for admission considered.   Dispo: DC Home. Return precautions discussed including, but not limited to, those listed in the AVS. Allowed pt time to ask questions which  were answered fully prior to dc.  Additional history obtained from son Records reviewed Outpatient Clinic Notes The following labs were independently interpreted: Chemistry and show no acute abnormality I independently reviewed the following imaging with scope of interpretation limited to determining acute life threatening conditions related to emergency care:  CT neck  and agree with the radiologist interpretation with the following exceptions: none I personally reviewed and interpreted cardiac monitoring:  Atrial bigeminy and tachycardia I personally reviewed and interpreted the pt's EKG: see above for interpretation  I have reviewed the patients home medications and made adjustments as needed Social Determinants of health:  Elderly         Final Clinical Impression(s) / ED Diagnoses Final diagnoses:  Strep pharyngitis  Periapical abscess    Rx / DC Orders ED Discharge Orders  Ordered    amoxicillin-clavulanate (AUGMENTIN) 875-125 MG tablet  Every 12 hours        06/03/23 1514              Rondel Baton, MD 06/03/23 1525

## 2023-06-03 NOTE — Discharge Instructions (Addendum)
You were seen for your strep throat in the emergency department.   At home, please take the antibiotic we have prescribed you.  Take Tylenol and ibuprofen for any pain that you have.  Check your MyChart online for the results of any tests that had not resulted by the time you left the emergency department.   Follow-up with ENT in 4 to 5 days regarding your visit.  Please keep this appointment even if your infection resolves because they may need to look down your throat to make sure there are no masses or tumors.  Follow-up with dentistry as soon as possible about your tooth infection.  Return immediately to the emergency department if you experience any of the following: Difficulty breathing, inability to swallow, or any other concerning symptoms.    Thank you for visiting our Emergency Department. It was a pleasure taking care of you today.

## 2023-06-06 LAB — CULTURE, GROUP A STREP (THRC)

## 2023-07-31 ENCOUNTER — Telehealth (INDEPENDENT_AMBULATORY_CARE_PROVIDER_SITE_OTHER): Payer: Self-pay | Admitting: Otolaryngology

## 2023-07-31 NOTE — Telephone Encounter (Signed)
Called patient to confirm service location, no answer, no vm

## 2023-08-01 ENCOUNTER — Institutional Professional Consult (permissible substitution) (INDEPENDENT_AMBULATORY_CARE_PROVIDER_SITE_OTHER): Payer: POS

## 2023-09-21 ENCOUNTER — Other Ambulatory Visit (HOSPITAL_BASED_OUTPATIENT_CLINIC_OR_DEPARTMENT_OTHER): Payer: Self-pay

## 2023-09-21 MED ORDER — RYBELSUS 14 MG PO TABS
14.0000 mg | ORAL_TABLET | Freq: Every day | ORAL | 1 refills | Status: AC
Start: 2023-09-21 — End: ?
  Filled 2023-09-21: qty 30, 30d supply, fill #0
  Filled 2023-11-09: qty 30, 30d supply, fill #1
  Filled 2023-12-15 – 2024-01-17 (×2): qty 30, 30d supply, fill #2

## 2023-10-03 ENCOUNTER — Other Ambulatory Visit (HOSPITAL_BASED_OUTPATIENT_CLINIC_OR_DEPARTMENT_OTHER): Payer: Self-pay

## 2023-11-20 ENCOUNTER — Other Ambulatory Visit (HOSPITAL_BASED_OUTPATIENT_CLINIC_OR_DEPARTMENT_OTHER): Payer: Self-pay

## 2023-11-22 ENCOUNTER — Other Ambulatory Visit (HOSPITAL_BASED_OUTPATIENT_CLINIC_OR_DEPARTMENT_OTHER): Payer: Self-pay

## 2023-11-22 MED ORDER — JARDIANCE 25 MG PO TABS
25.0000 mg | ORAL_TABLET | Freq: Every day | ORAL | 2 refills | Status: AC
  Filled 2023-11-22: qty 30, 30d supply, fill #0

## 2023-12-15 ENCOUNTER — Other Ambulatory Visit (HOSPITAL_BASED_OUTPATIENT_CLINIC_OR_DEPARTMENT_OTHER): Payer: Self-pay

## 2023-12-19 ENCOUNTER — Other Ambulatory Visit (HOSPITAL_BASED_OUTPATIENT_CLINIC_OR_DEPARTMENT_OTHER): Payer: Self-pay

## 2024-01-17 ENCOUNTER — Other Ambulatory Visit (HOSPITAL_BASED_OUTPATIENT_CLINIC_OR_DEPARTMENT_OTHER): Payer: Self-pay

## 2024-01-29 ENCOUNTER — Other Ambulatory Visit (HOSPITAL_BASED_OUTPATIENT_CLINIC_OR_DEPARTMENT_OTHER): Payer: Self-pay

## 2024-07-01 ENCOUNTER — Other Ambulatory Visit (HOSPITAL_BASED_OUTPATIENT_CLINIC_OR_DEPARTMENT_OTHER): Payer: Self-pay

## 2024-07-01 MED ORDER — RYBELSUS 3 MG PO TABS
3.0000 mg | ORAL_TABLET | Freq: Every day | ORAL | 0 refills | Status: AC
  Filled 2024-07-01: qty 30, 30d supply, fill #0

## 2024-07-22 ENCOUNTER — Other Ambulatory Visit (HOSPITAL_BASED_OUTPATIENT_CLINIC_OR_DEPARTMENT_OTHER): Payer: Self-pay

## 2024-07-22 MED ORDER — ROSUVASTATIN CALCIUM 10 MG PO TABS
10.0000 mg | ORAL_TABLET | Freq: Every day | ORAL | 1 refills | Status: AC
Start: 2024-07-22 — End: ?
  Filled 2024-07-22: qty 30, 30d supply, fill #0

## 2024-08-01 ENCOUNTER — Other Ambulatory Visit (HOSPITAL_BASED_OUTPATIENT_CLINIC_OR_DEPARTMENT_OTHER): Payer: Self-pay

## 2024-09-25 ENCOUNTER — Other Ambulatory Visit (HOSPITAL_BASED_OUTPATIENT_CLINIC_OR_DEPARTMENT_OTHER): Payer: Self-pay

## 2024-09-25 MED ORDER — RYBELSUS 14 MG PO TABS
14.0000 mg | ORAL_TABLET | Freq: Every day | ORAL | 1 refills | Status: AC
  Filled 2024-09-25: qty 30, 30d supply, fill #0

## 2024-09-26 ENCOUNTER — Other Ambulatory Visit (HOSPITAL_BASED_OUTPATIENT_CLINIC_OR_DEPARTMENT_OTHER): Payer: Self-pay

## 2024-10-07 ENCOUNTER — Other Ambulatory Visit (HOSPITAL_BASED_OUTPATIENT_CLINIC_OR_DEPARTMENT_OTHER): Payer: Self-pay
